# Patient Record
Sex: Male | Born: 1970 | Hispanic: No | Marital: Married | Smoking: Never smoker
Health system: Southern US, Community
[De-identification: ages and names within clinical notes are randomized; demographics above are authoritative.]

---

## 1998-03-20 ENCOUNTER — Other Ambulatory Visit: Admission: RE | Admit: 1998-03-20 | Discharge: 1998-03-20 | Payer: Self-pay | Admitting: Otolaryngology

## 2006-04-15 ENCOUNTER — Ambulatory Visit: Payer: Self-pay | Admitting: Family Medicine

## 2006-05-18 ENCOUNTER — Ambulatory Visit: Payer: Self-pay | Admitting: Family Medicine

## 2006-06-16 ENCOUNTER — Ambulatory Visit: Payer: Self-pay | Admitting: Family Medicine

## 2006-07-08 ENCOUNTER — Encounter: Admission: RE | Admit: 2006-07-08 | Discharge: 2006-07-08 | Payer: Self-pay | Admitting: Family Medicine

## 2007-08-05 IMAGING — CT CT PARANASAL SINUSES LIMITED
1 series · 16 of 28 positions shown, 20 images · non-contrast
Comparison: none

CLINICAL DATA: Sinusitis.  Congestion.  
 LIMITED CT OF PARANASAL SINUSES:
TECHNIQUE: Limited coronal CT images were obtained through the paranasal sinuses without intravenous contrast.

[Series 2: limited sinus · axial · 0.33mm/px · z∈[+6,+107]mm · 16 of 28 slices shown, 20 images]
[im 2/28  brain]
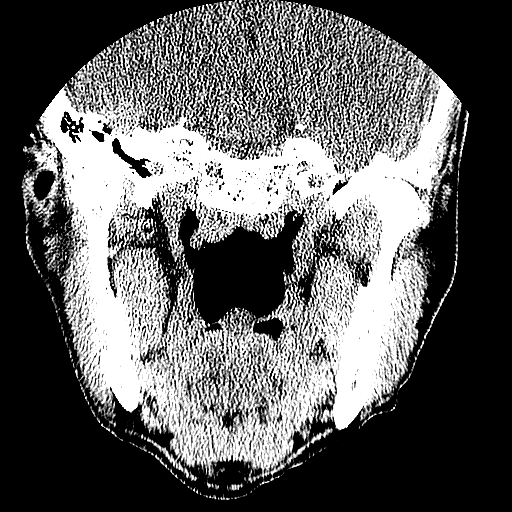
[im 2/28  bone]
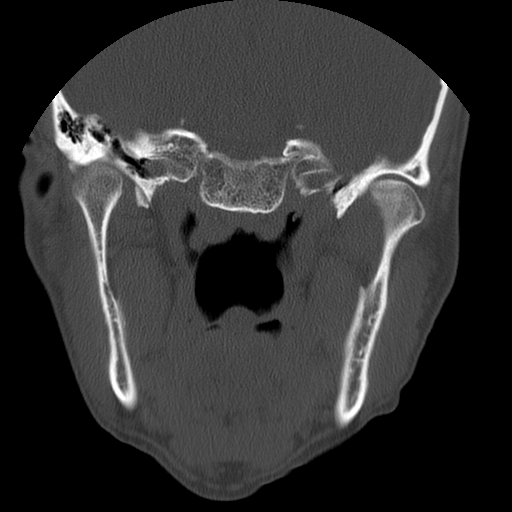
[im 4/28  bone]
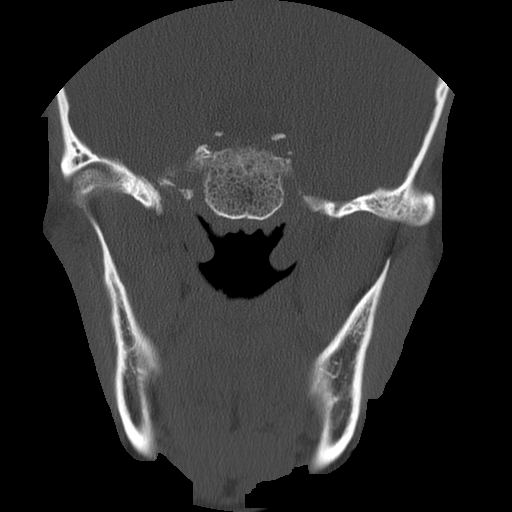
[im 6/28  bone]
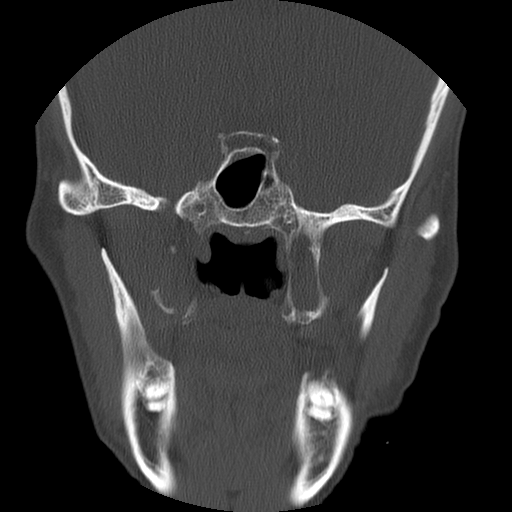
[im 7/28  bone]
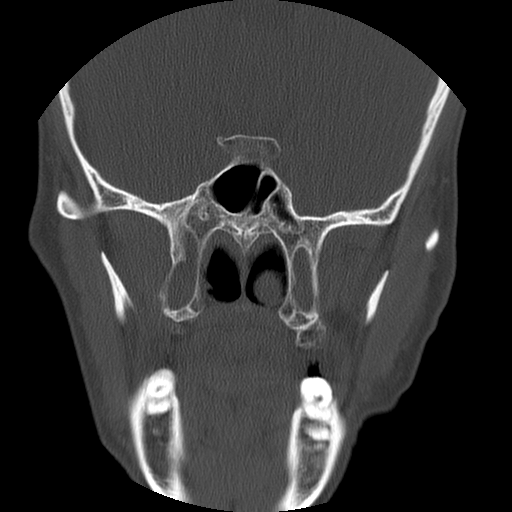
[im 9/28  brain]
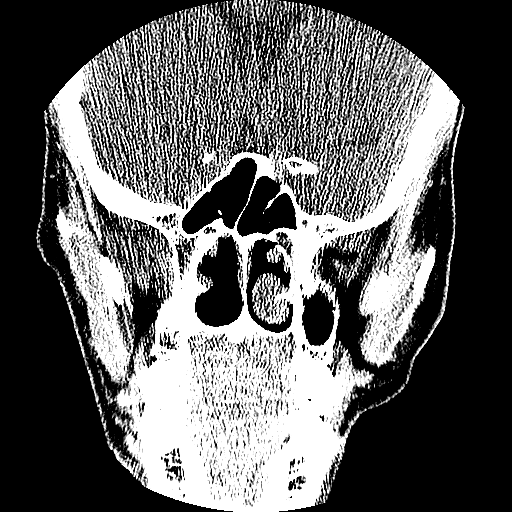
[im 9/28  bone]
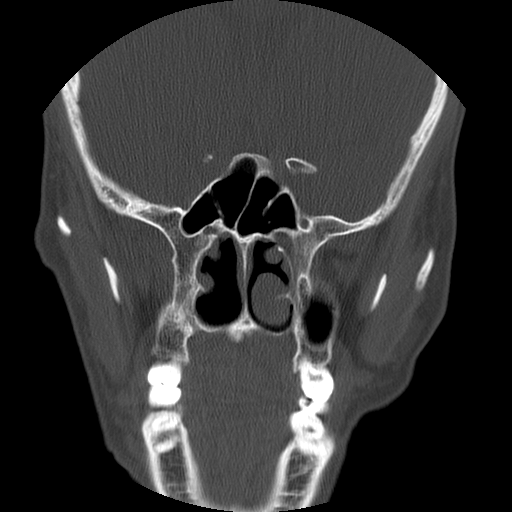
[im 10/28  bone]
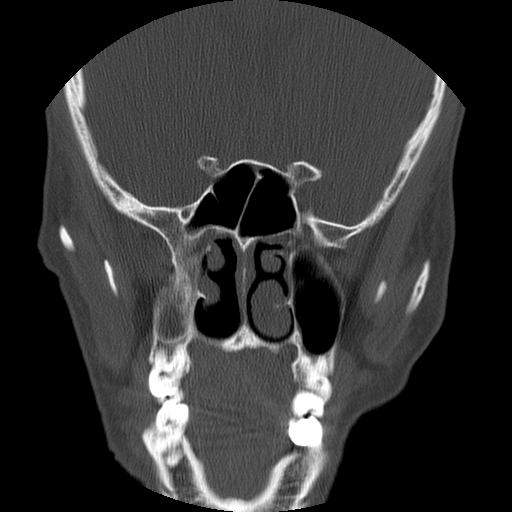
[im 12/28  bone]
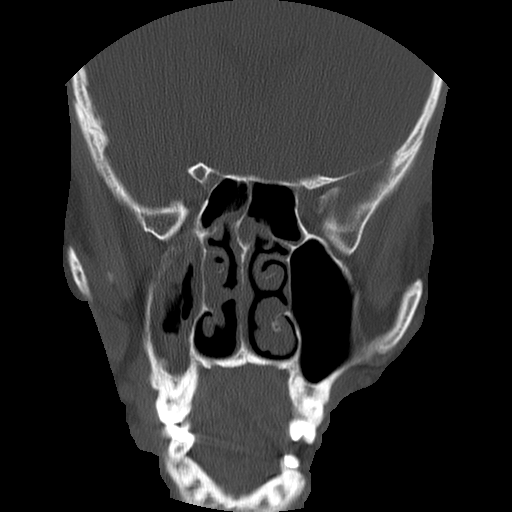
[im 14/28  bone]
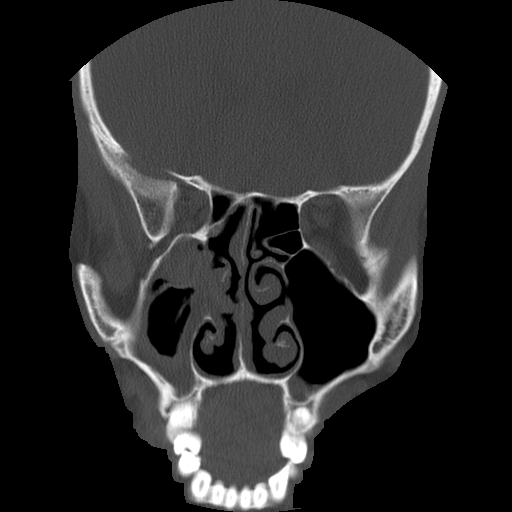
[im 15/28  brain]
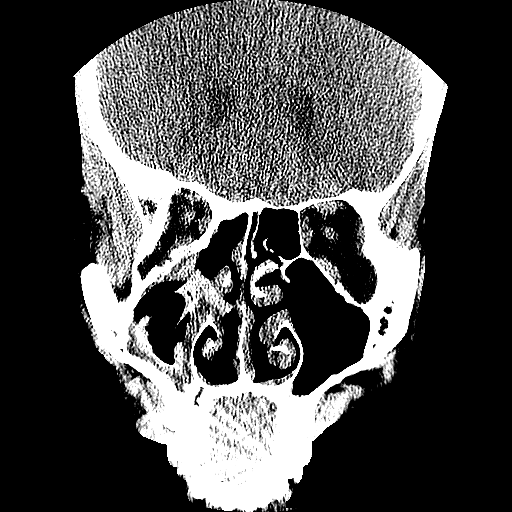
[im 15/28  bone]
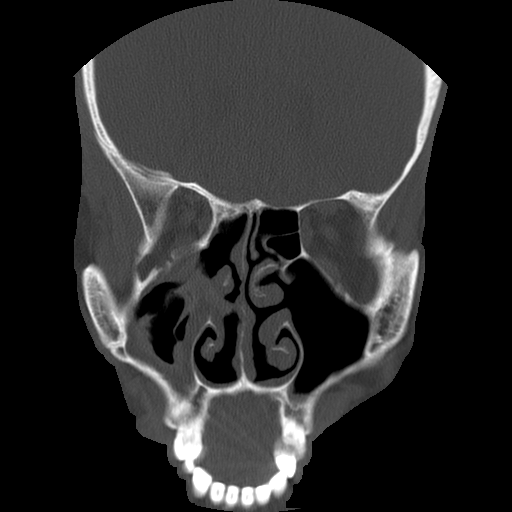
[im 17/28  bone]
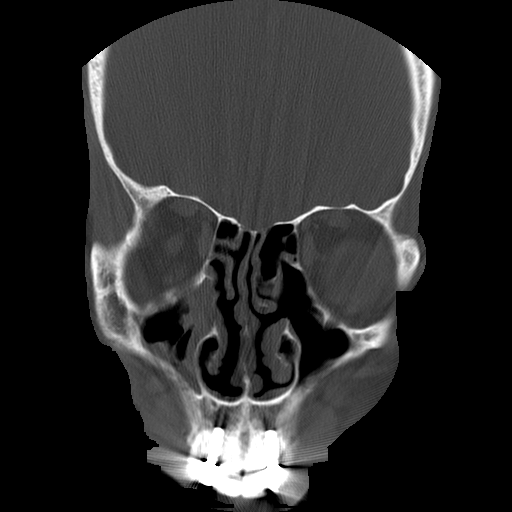
[im 19/28  bone]
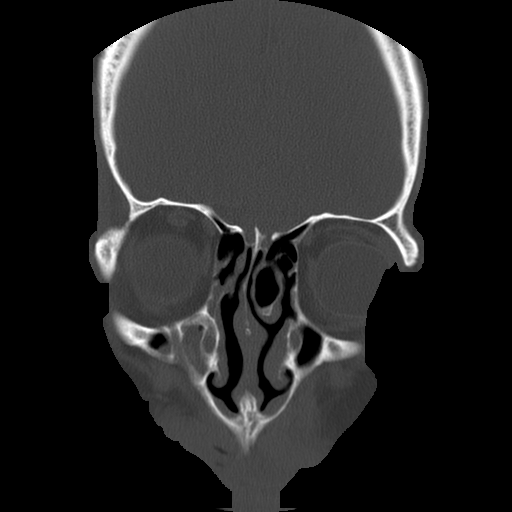
[im 20/28  bone]
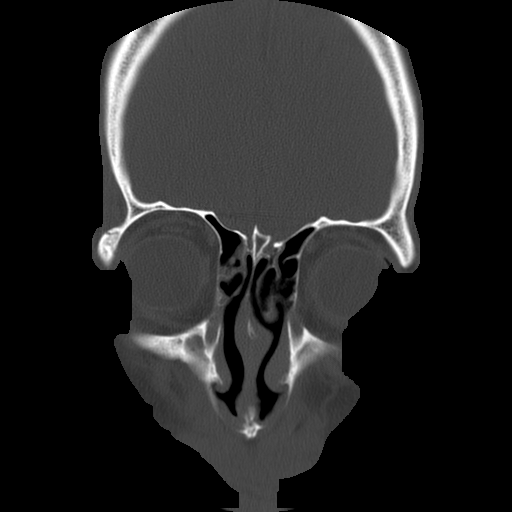
[im 22/28  brain]
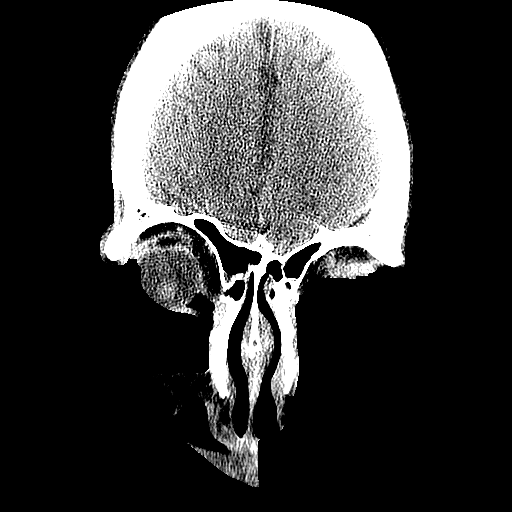
[im 22/28  bone]
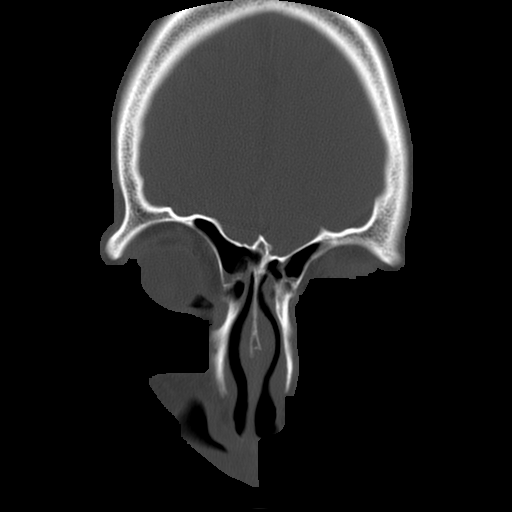
[im 23/28  bone]
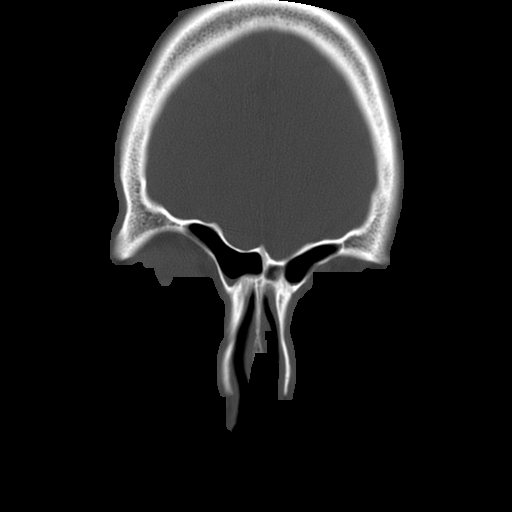
[im 25/28  bone]
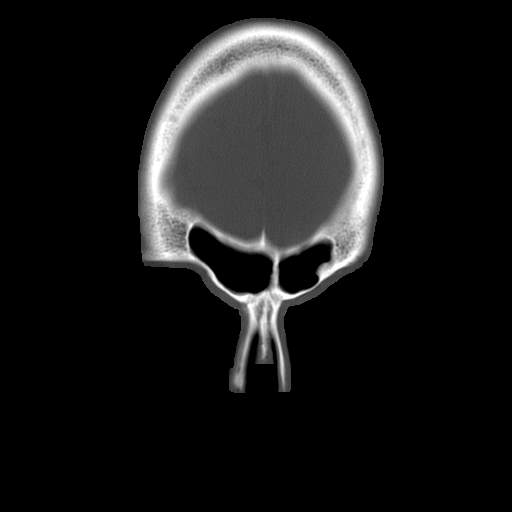
[im 27/28  bone]
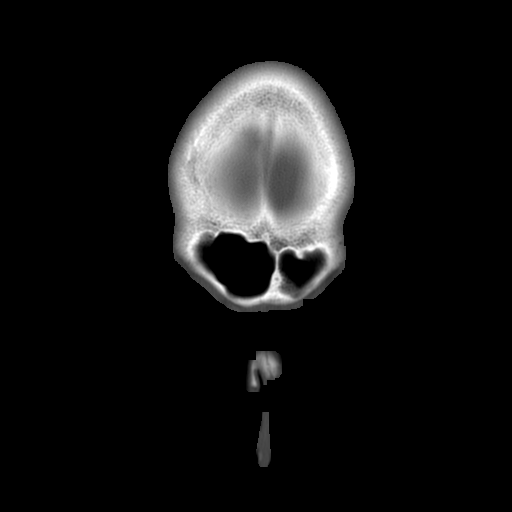

[16 of 28 positions shown; findings below may reference images not displayed]

FINDINGS: The patient has had nasoantral window formation bilaterally.  There is mucosal inflammatory disease of the left maxillary sinus.  I do not see any free fluid in the sinus.  The right maxillary sinus is clear except for a small retention cyst at the floor.  The frontal sinus is clear.  The sphenoid and ethmoid regions show some areas of mild mucosal thickening.  The nasal septum does not appear significantly deviated.
IMPRESSION: 1.  Previous nasoantral windows bilaterally.
 2.  Mucosal inflammatory disease of the left maxillary sinus, but no free fluid. 
 3.  Mucosal inflammatory changes of the anterior sphenoid and the ethmoid regions, fairly mild, but present.

## 2007-08-17 ENCOUNTER — Ambulatory Visit: Payer: Self-pay | Admitting: Family Medicine

## 2007-09-18 ENCOUNTER — Ambulatory Visit: Payer: Self-pay | Admitting: Family Medicine

## 2009-08-25 ENCOUNTER — Ambulatory Visit: Payer: Self-pay | Admitting: Family Medicine

## 2010-10-03 ENCOUNTER — Encounter: Payer: Self-pay | Admitting: Family Medicine

## 2016-08-23 DIAGNOSIS — F320 Major depressive disorder, single episode, mild: Secondary | ICD-10-CM | POA: Diagnosis not present

## 2016-10-04 DIAGNOSIS — F320 Major depressive disorder, single episode, mild: Secondary | ICD-10-CM | POA: Diagnosis not present

## 2016-10-11 DIAGNOSIS — F320 Major depressive disorder, single episode, mild: Secondary | ICD-10-CM | POA: Diagnosis not present

## 2016-10-18 DIAGNOSIS — F320 Major depressive disorder, single episode, mild: Secondary | ICD-10-CM | POA: Diagnosis not present

## 2016-10-25 DIAGNOSIS — F320 Major depressive disorder, single episode, mild: Secondary | ICD-10-CM | POA: Diagnosis not present

## 2016-11-01 DIAGNOSIS — F320 Major depressive disorder, single episode, mild: Secondary | ICD-10-CM | POA: Diagnosis not present

## 2016-11-08 DIAGNOSIS — F320 Major depressive disorder, single episode, mild: Secondary | ICD-10-CM | POA: Diagnosis not present

## 2016-11-15 DIAGNOSIS — F320 Major depressive disorder, single episode, mild: Secondary | ICD-10-CM | POA: Diagnosis not present

## 2016-11-16 DIAGNOSIS — F320 Major depressive disorder, single episode, mild: Secondary | ICD-10-CM | POA: Diagnosis not present

## 2016-11-16 DIAGNOSIS — R197 Diarrhea, unspecified: Secondary | ICD-10-CM | POA: Diagnosis not present

## 2016-11-16 DIAGNOSIS — F988 Other specified behavioral and emotional disorders with onset usually occurring in childhood and adolescence: Secondary | ICD-10-CM | POA: Diagnosis not present

## 2016-11-16 DIAGNOSIS — Z7689 Persons encountering health services in other specified circumstances: Secondary | ICD-10-CM | POA: Diagnosis not present

## 2016-11-22 DIAGNOSIS — F320 Major depressive disorder, single episode, mild: Secondary | ICD-10-CM | POA: Diagnosis not present

## 2016-12-09 DIAGNOSIS — F320 Major depressive disorder, single episode, mild: Secondary | ICD-10-CM | POA: Diagnosis not present

## 2016-12-15 DIAGNOSIS — R5383 Other fatigue: Secondary | ICD-10-CM | POA: Diagnosis not present

## 2016-12-15 DIAGNOSIS — R197 Diarrhea, unspecified: Secondary | ICD-10-CM | POA: Diagnosis not present

## 2016-12-15 DIAGNOSIS — Z136 Encounter for screening for cardiovascular disorders: Secondary | ICD-10-CM | POA: Diagnosis not present

## 2016-12-15 DIAGNOSIS — F322 Major depressive disorder, single episode, severe without psychotic features: Secondary | ICD-10-CM | POA: Diagnosis not present

## 2016-12-15 DIAGNOSIS — Z1389 Encounter for screening for other disorder: Secondary | ICD-10-CM | POA: Diagnosis not present

## 2016-12-15 DIAGNOSIS — Z131 Encounter for screening for diabetes mellitus: Secondary | ICD-10-CM | POA: Diagnosis not present

## 2016-12-15 DIAGNOSIS — F988 Other specified behavioral and emotional disorders with onset usually occurring in childhood and adolescence: Secondary | ICD-10-CM | POA: Diagnosis not present

## 2016-12-15 DIAGNOSIS — Z13220 Encounter for screening for lipoid disorders: Secondary | ICD-10-CM | POA: Diagnosis not present

## 2016-12-28 DIAGNOSIS — F320 Major depressive disorder, single episode, mild: Secondary | ICD-10-CM | POA: Diagnosis not present

## 2017-01-07 DIAGNOSIS — F320 Major depressive disorder, single episode, mild: Secondary | ICD-10-CM | POA: Diagnosis not present

## 2017-01-17 DIAGNOSIS — F320 Major depressive disorder, single episode, mild: Secondary | ICD-10-CM | POA: Diagnosis not present

## 2017-01-17 DIAGNOSIS — F988 Other specified behavioral and emotional disorders with onset usually occurring in childhood and adolescence: Secondary | ICD-10-CM | POA: Diagnosis not present

## 2017-01-31 DIAGNOSIS — D485 Neoplasm of uncertain behavior of skin: Secondary | ICD-10-CM | POA: Diagnosis not present

## 2017-01-31 DIAGNOSIS — L820 Inflamed seborrheic keratosis: Secondary | ICD-10-CM | POA: Diagnosis not present

## 2017-01-31 DIAGNOSIS — L821 Other seborrheic keratosis: Secondary | ICD-10-CM | POA: Diagnosis not present

## 2017-01-31 DIAGNOSIS — D225 Melanocytic nevi of trunk: Secondary | ICD-10-CM | POA: Diagnosis not present

## 2017-01-31 DIAGNOSIS — L578 Other skin changes due to chronic exposure to nonionizing radiation: Secondary | ICD-10-CM | POA: Diagnosis not present

## 2017-02-22 DIAGNOSIS — F320 Major depressive disorder, single episode, mild: Secondary | ICD-10-CM | POA: Diagnosis not present

## 2017-04-04 DIAGNOSIS — F320 Major depressive disorder, single episode, mild: Secondary | ICD-10-CM | POA: Diagnosis not present

## 2017-04-19 DIAGNOSIS — F320 Major depressive disorder, single episode, mild: Secondary | ICD-10-CM | POA: Diagnosis not present

## 2017-04-19 DIAGNOSIS — F988 Other specified behavioral and emotional disorders with onset usually occurring in childhood and adolescence: Secondary | ICD-10-CM | POA: Diagnosis not present

## 2017-04-27 DIAGNOSIS — F320 Major depressive disorder, single episode, mild: Secondary | ICD-10-CM | POA: Diagnosis not present

## 2017-05-11 DIAGNOSIS — F320 Major depressive disorder, single episode, mild: Secondary | ICD-10-CM | POA: Diagnosis not present

## 2017-05-23 DIAGNOSIS — F320 Major depressive disorder, single episode, mild: Secondary | ICD-10-CM | POA: Diagnosis not present

## 2017-06-13 DIAGNOSIS — F320 Major depressive disorder, single episode, mild: Secondary | ICD-10-CM | POA: Diagnosis not present

## 2017-07-20 DIAGNOSIS — F320 Major depressive disorder, single episode, mild: Secondary | ICD-10-CM | POA: Diagnosis not present

## 2017-08-17 DIAGNOSIS — F320 Major depressive disorder, single episode, mild: Secondary | ICD-10-CM | POA: Diagnosis not present

## 2017-10-05 DIAGNOSIS — F320 Major depressive disorder, single episode, mild: Secondary | ICD-10-CM | POA: Diagnosis not present

## 2017-11-04 DIAGNOSIS — J0140 Acute pansinusitis, unspecified: Secondary | ICD-10-CM | POA: Diagnosis not present

## 2017-11-04 DIAGNOSIS — R5381 Other malaise: Secondary | ICD-10-CM | POA: Diagnosis not present

## 2017-11-04 DIAGNOSIS — J101 Influenza due to other identified influenza virus with other respiratory manifestations: Secondary | ICD-10-CM | POA: Diagnosis not present

## 2017-11-16 DIAGNOSIS — F320 Major depressive disorder, single episode, mild: Secondary | ICD-10-CM | POA: Diagnosis not present

## 2018-01-23 DIAGNOSIS — F320 Major depressive disorder, single episode, mild: Secondary | ICD-10-CM | POA: Diagnosis not present

## 2018-04-17 DIAGNOSIS — F320 Major depressive disorder, single episode, mild: Secondary | ICD-10-CM | POA: Diagnosis not present

## 2018-06-19 ENCOUNTER — Ambulatory Visit (INDEPENDENT_AMBULATORY_CARE_PROVIDER_SITE_OTHER): Payer: BLUE CROSS/BLUE SHIELD | Admitting: Psychiatry

## 2018-06-19 DIAGNOSIS — F320 Major depressive disorder, single episode, mild: Secondary | ICD-10-CM | POA: Diagnosis not present

## 2018-06-19 NOTE — Progress Notes (Signed)
      [facility] Counselor/Therapist Progress Note   Patient ID: Martin Perez, MRN: [id number]  Date: 06/19/2018  Timespent: 45 minutes  Treatment Type: Individual  Subjective: Client reports that he did not get the job at the [employer].  He is disappointed and is unsure what he and his wife will do financially going forward.  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  They have recently moved into their rental house here in [place].  His wife is homeschooling their children.  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  The client feels like he is doing well overall but he states he still has self image issues and insecurity.  Interventions:Solution Focused and Supportive  Mental Status Exam:   Appearance:   Well Groomed     Behavior:  Appropriate  Motor:  Normal  Speech/Language:   Clear and Coherent  Affect:  Appropriate  Mood:  anxious  Thought process:  Coherent  Thought content:    Logical  Perceptual disturbances:    Normal  Orientation:  Full (Time, Place, and Person)  Attention:  Good  Concentration:  good  Memory:  Immediate  Fund of knowledge:   Good  Insight:    Good  Judgment:   Good  Impulse Control:  good    Reported Symptoms: Insecurity, self-image issues  Risk Assessment: Danger to Self:  No Self-injurious Behavior: No Danger to Others: No Duty to Warn:no Physical Aggression / Violence:No  Access to Firearms a concern: No  Gang Involvement:No   Diagnosis:   ICD-10-CM   1. Major depressive disorder, single episode, mild (HCC) F32.0      Plan: Apply for  jobs, meet with local pastors for referrals. Exercise, selfcare  Gelene Mink Emilie Carp, [facility]

## 2018-08-14 ENCOUNTER — Ambulatory Visit (INDEPENDENT_AMBULATORY_CARE_PROVIDER_SITE_OTHER): Payer: BLUE CROSS/BLUE SHIELD | Admitting: Psychiatry

## 2018-08-14 ENCOUNTER — Encounter (INDEPENDENT_AMBULATORY_CARE_PROVIDER_SITE_OTHER): Payer: Self-pay

## 2018-08-14 ENCOUNTER — Encounter: Payer: Self-pay | Admitting: Psychiatry

## 2018-08-14 DIAGNOSIS — F4321 Adjustment disorder with depressed mood: Secondary | ICD-10-CM | POA: Diagnosis not present

## 2018-08-14 NOTE — Progress Notes (Signed)
      [facility] Counselor/Therapist Progress Note  Patient ID: Martin Perez, MRN: [id number],    Date: 08/14/2018  Time Spent: 46 minutes   Treatment Type: Individual Therapy  Reported Symptoms: Irritability  Mental Status Exam:  Appearance:   Well Groomed     Behavior:  Appropriate  Motor:  Normal  Speech/Language:   Clear and Coherent  Affect:  Appropriate  Mood:  irritable  Thought process:  normal  Thought content:    WNL  Sensory/Perceptual disturbances:    WNL  Orientation:  oriented to person, place, time/date and situation  Attention:  Good  Concentration:  Good  Memory:  WNL  Fund of knowledge:   Good  Insight:    Good  Judgment:   Good  Impulse Control:  Good   Risk Assessment: Danger to Self:  No Self-injurious Behavior: No Danger to Others: No Duty to Warn:no Physical Aggression / Violence:No  Access to Firearms a concern: No  Gang Involvement:No   Subjective: The client reports that he and his family were unable to move into the rental house that they owned.  They had wanted to get a construction alone to do some remodeling but the underwriter felt like their previous income was not a good match.  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  He states, "it is okay for the moment."  Client states that he and his wife and family still live with his mother in [place].  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  Overall he feels that he and his wife are doing okay but he knows that he needs to do some marriage counseling with her.  In the big picture he feels like he is doing okay.  There is still some irritability here and there.  He agrees to follow-up in February or March.  Interventions: Solution-Oriented/Positive Psychology  and Insight-Oriented  Diagnosis:   ICD-10-CM   1. Adjustment disorder with depressed mood F43.21     Plan: Marriage counseling, vocational search.  Gelene MinkFrederick Kamal Jurgens, [facility]

## 2018-09-25 ENCOUNTER — Ambulatory Visit: Payer: BLUE CROSS/BLUE SHIELD | Admitting: Psychiatry

## 2018-09-25 ENCOUNTER — Ambulatory Visit: Payer: Self-pay | Admitting: Psychiatry

## 2018-11-13 DIAGNOSIS — J4520 Mild intermittent asthma, uncomplicated: Secondary | ICD-10-CM | POA: Diagnosis not present

## 2018-11-13 DIAGNOSIS — Z79899 Other long term (current) drug therapy: Secondary | ICD-10-CM | POA: Diagnosis not present

## 2018-11-13 DIAGNOSIS — M25542 Pain in joints of left hand: Secondary | ICD-10-CM | POA: Diagnosis not present

## 2018-11-14 ENCOUNTER — Ambulatory Visit (INDEPENDENT_AMBULATORY_CARE_PROVIDER_SITE_OTHER): Payer: 59 | Admitting: Psychiatry

## 2018-11-14 ENCOUNTER — Encounter: Payer: Self-pay | Admitting: Psychiatry

## 2018-11-14 DIAGNOSIS — F4321 Adjustment disorder with depressed mood: Secondary | ICD-10-CM

## 2018-11-14 NOTE — Progress Notes (Signed)
      [facility] Counselor/Therapist Progress Note  Patient ID: Martin Perez, MRN: [id number],    Date: 11/14/2018  Time Spent: 51 minutes   Treatment Type: Individual Therapy  Reported Symptoms: anxiety, stress  Mental Status Exam:  Appearance:   Casual and Well Groomed     Behavior:  Appropriate  Motor:  Normal  Speech/Language:   Clear and Coherent  Affect:  Appropriate  Mood:  anxious and irritable  Thought process:  normal  Thought content:    WNL  Sensory/Perceptual disturbances:    WNL  Orientation:  oriented to person, place, time/date and situation  Attention:  Good  Concentration:  Good  Memory:  WNL  Fund of knowledge:   Good  Insight:    Good  Judgment:   Good  Impulse Control:  Good   Risk Assessment: Danger to Self:  No Self-injurious Behavior: No Danger to Others: No Duty to Warn:no Access to Firearms a concern: No  Gang Involvement:No   Subjective: 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  Unfortunately he is not getting paid enough.  Client's concerns today was; 1) he has a bad habit of staying late at the office. 2) should his family moved to [place] versus continuing to live in [place]. Today we used EMDR around the client staying late at his office.  He realized that he did not want to go home.  He wanted to avoid the chaos with his 3 boys at home.  We discussed the fact that at the end of the day his Vyvanse is wearing off and he has less focusing ability and more irritability.  The client has used the amino acid theanine in the past which has worked well for him in reducing his physical and mental stress.  The client agrees to take some before he comes home so that he will have more margin.  This way he can give his wife a break as well.  The client experienced a lot of relief coming to this conclusion.  His subjective units of distress went from a 7  to less than 2.   Interventions: Solution-Oriented/Positive Psychology, Eye Movement Desensitization and Reprocessing (EMDR) and Insight-Oriented  Diagnosis:   ICD-10-CM   1. Adjustment disorder with depressed mood F43.21     Plan: l-theanine, clock out earlier at work, exercise, family engagement.  This record has been created using [employer].  Chart creation errors have been sought, but Karrie Fluellen not always have been located and corrected. Such creation errors do not reflect on the standard of medical care.   Javionna Leder, [place]

## 2019-02-08 ENCOUNTER — Ambulatory Visit (INDEPENDENT_AMBULATORY_CARE_PROVIDER_SITE_OTHER): Payer: 59 | Admitting: Psychiatry

## 2019-02-08 ENCOUNTER — Other Ambulatory Visit: Payer: Self-pay

## 2019-02-08 ENCOUNTER — Encounter: Payer: Self-pay | Admitting: Psychiatry

## 2019-02-08 DIAGNOSIS — F4323 Adjustment disorder with mixed anxiety and depressed mood: Secondary | ICD-10-CM

## 2019-02-08 NOTE — Progress Notes (Signed)
      [facility] Counselor/Therapist Progress Note  Patient ID: Martin Perez, MRN: [id number],    Date: 02/08/2019  Time Spent: 50 minutes   Treatment Type: Individual Therapy  Reported Symptoms: anxiety, frustration  Mental Status Exam:  Appearance:   Casual and Well Groomed     Behavior:  Appropriate  Motor:  Normal  Speech/Language:   Clear and Coherent  Affect:  Appropriate  Mood:  anxious and irritable  Thought process:  normal  Thought content:    WNL  Sensory/Perceptual disturbances:    WNL  Orientation:  oriented to person, place, time/date and situation  Attention:  Good  Concentration:  Good  Memory:  WNL  Fund of knowledge:   Good  Insight:    Good  Judgment:   Good  Impulse Control:  Good   Risk Assessment: Danger to Self:  No Self-injurious Behavior: No Danger to Others: No Duty to Warn:no Physical Aggression / Violence:No  Access to Firearms a concern: No  Gang Involvement:No   Subjective: I met with the client face-to-face today.  We both had facemasks.  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 Today the client wanted to work on assertiveness.  "I tend to be passive.  I lack self-confidence." The client has an issue with one of his coworkers.  We focused on this target using eye-movement.  His negative thought was, "what right do I have?"  He felt fear and anger in his chest.  His subjective units of distress started at a 5+ and ended less than 2. As the client processed the fear and anger that he felt using eye-movement he realized that he was afraid of retaliation.  He also felt over responsible for the other person's feelings.  As we discussed this the client understood that that behavior to be codependent.  I gave the client a handout on  assertive rights and irrational versus rational beliefs.  The client went throusgh the assertive rights and saw that he did have the right to speak his mind and asked for what he needed.  As he continued to process his anxiety and anger dropped to less than 2.  "I do have the right for what I want."  Interventions: Assertiveness/Communication, Solution-Oriented/Positive Psychology, Psycho-education/Bibliotherapy, Eye Movement Desensitization and Reprocessing (EMDR) and Insight-Oriented  Diagnosis:   ICD-10-CM   1. Adjustment disorder with mixed anxiety and depressed mood F43.23     Plan: Review assertive handout, boundaries, assertiveness, self-care, exercise, positive self talk.  This record has been created using [employer].  Chart creation errors have been sought, but Jaelan Rasheed not always have been located and corrected. Such creation errors do not reflect on the standard of medical care.  Ludmila Ebarb, [facility]

## 2019-05-10 ENCOUNTER — Ambulatory Visit: Payer: 59 | Admitting: Psychiatry

## 2019-06-07 ENCOUNTER — Other Ambulatory Visit: Payer: Self-pay

## 2019-06-07 ENCOUNTER — Ambulatory Visit (INDEPENDENT_AMBULATORY_CARE_PROVIDER_SITE_OTHER): Payer: 59 | Admitting: Psychiatry

## 2019-06-07 ENCOUNTER — Encounter: Payer: Self-pay | Admitting: Psychiatry

## 2019-06-07 DIAGNOSIS — F4323 Adjustment disorder with mixed anxiety and depressed mood: Secondary | ICD-10-CM | POA: Diagnosis not present

## 2019-06-07 NOTE — Progress Notes (Signed)
[facility] Counselor/Therapist Progress Note  Patient ID: Martin Perez, MRN: [id number],    Date: 06/07/2019  Time Spent: 50 minutes   Treatment Type: Individual Therapy  Reported Symptoms: anxious, irritated, defensive.  Mental Status Exam:  Appearance:   Well Groomed     Behavior:  Appropriate  Motor:  Normal  Speech/Language:   Clear and Coherent  Affect:  Appropriate  Mood:  anxious and irritable  Thought process:  normal  Thought content:    WNL  Sensory/Perceptual disturbances:    WNL  Orientation:  oriented to person, place, time/date and situation  Attention:  Good  Concentration:  Good  Memory:  WNL  Fund of knowledge:   Good  Insight:    Good  Judgment:   Good  Impulse Control:  Good   Risk Assessment: Danger to Self:  No Self-injurious Behavior: No Danger to Others: No Duty to Warn:no Physical Aggression / Violence:No  Access to Firearms a concern: No  Gang Involvement:No   Subjective: The client states that he is still employed for which he is thankful.  The client states today that he wants to work on his defensiveness with his wife.  He notes that if she asks him questions they can come across as critical to him.  The messages he interprets are, "I am not good enough.  I am a screw up.  I am not living up to my potential." I used eye-movement with the client around this defensiveness he feels with his wife.  His negative cognition is, "I must be perfect."  He feels it in his chest with anxiety and tension.  His subjective units of distress is a 6.  As the client processed he realizes that he has been overtaxed.  He is remodeling their house here in [place] that they hope to move into in October.  He still commutes back and forth from [place] to [place] for work.  He has 3 boys at home that have some level of attention deficit all under the age of 63.  His wife homeschools them full-time.  His job can require up to 12 hours a day plus the  remodeling at their house.  Money is tight which also increases his stress.  The client does get up early to exercise but really has little time to spend with his wife.  As the client continued to process he realized he needed more radical acceptance that his life is the way it is in this season.  I used the bilateral stimulation hand paddles with the client.  He focused on the image of being at the beach.  He was able to quiet himself and reduce his subjective units of distress to less than 1.  I gave the client a handout on mindfulness practices.  I asked him to pick 1 and practice it for 5 minutes each day.  The client agreed.  The goal is to gain control of his mind so that it is not racing all over the place.  Also to be able to use the practice reflexively during the day if he needs to.  I also asked him to include stretching and his exercise regimen to help with his overall tension.  He agreed.  When the client thought about interacting with his wife he felt much calmer.  Interventions: Assertiveness/Communication, Mindfulness Meditation, Motivational Interviewing, Solution-Oriented/Positive Psychology, Psycho-education/Bibliotherapy, Eye Movement Desensitization and Reprocessing (EMDR) and Insight-Oriented  Diagnosis:   ICD-10-CM   1. Adjustment disorder with  mixed anxiety and depressed mood  F43.23     Plan: Mindfulness, stretching, exercise, self-care, boundaries, assertiveness, positive self talk, radical acceptance.  Raissa Dam, [facility]

## 2019-07-24 ENCOUNTER — Encounter: Payer: Self-pay | Admitting: Psychiatry

## 2019-07-24 ENCOUNTER — Other Ambulatory Visit: Payer: Self-pay

## 2019-07-24 ENCOUNTER — Ambulatory Visit (INDEPENDENT_AMBULATORY_CARE_PROVIDER_SITE_OTHER): Payer: 59 | Admitting: Psychiatry

## 2019-07-24 DIAGNOSIS — F4323 Adjustment disorder with mixed anxiety and depressed mood: Secondary | ICD-10-CM

## 2019-07-24 NOTE — Progress Notes (Signed)
[facility] Counselor/Therapist Progress Note  Patient ID: Martin Perez, MRN: [id number],    Date: 07/24/2019  Time Spent: 50 minutes   Treatment Type: Individual Therapy  Reported Symptoms: anxious, sad, stressed  Mental Status Exam:  Appearance:   Well Groomed     Behavior:  Appropriate  Motor:  Normal  Speech/Language:   Clear and Coherent  Affect:  Appropriate  Mood:  anxious and sad  Thought process:  normal  Thought content:    WNL  Sensory/Perceptual disturbances:    WNL  Orientation:  oriented to person, place, time/date and situation  Attention:  Good  Concentration:  Good  Memory:  WNL  Fund of knowledge:   Good  Insight:    Good  Judgment:   Good  Impulse Control:  Good   Risk Assessment: Danger to Self:  No Self-injurious Behavior: No Danger to Others: No Duty to Warn:no Physical Aggression / Violence:No  Access to Firearms a concern: No  Gang Involvement:No   Subjective: The client states that he had to drop the prescription Vyvanse due to the cost.  The client has a high deductible plan through his company and he had to move to Adderall XR.  "It makes me spacey and I lose motivation in the afternoon."  In addition to working 10-hour day the client goes to the house that he and his wife are renovating and works on that until late in the evening.  He states he is rarely home and probably gets 4 to 6 hours of sleep at night. We discussed that this is not a healthy schedule for him.  No doubt it does not help his attention deficit disorder.  We discussed talking to his physician to see if he could take an early afternoon dose to take him through the evening.  The client agreed to do so.  The client is taking a high dose of fish oil along with his ADD meds. The client has a stress level on a subjective units of distress of 8.  We used eye-movement around the issues with his house, work, the long hours, his children and his wife.  His negative  cognition is, "I have abandoned them."  He feels stress and anxiety and sadness in the center of his chest.  As the client processed he knows that he needs to cut the long hours out.  They are trying to get into the house the weekend after Thanksgiving.  He feels like he just needs to push through until then.  I advised the client to have a longer conversation with his wife discussing whether or not he needs to dial back his time so he can spend more time at home.  The client will consider this as well. The client identifies his middle child as having what seems like an intermittent explosive disorder.  It makes things very difficult for his other 2 children and also difficult for his wife during the course of the day.  As he continued to process he noted that he really does try to engage with his family when he is there.  As he discussed this his subjective units of distress dropped to less than 2.  He knows if he begins to implement the things we discussed, "it will be okay."  Which was his positive cognition at the end of the session.  Interventions: Assertiveness/Communication, Motivational Interviewing, Solution-Oriented/Positive Psychology, [employer] Desensitization and Reprocessing (EMDR) and Insight-Oriented  Diagnosis:   ICD-10-CM  1. Adjustment disorder with mixed anxiety and depressed mood  F43.23     Plan: Boundaries, assertiveness, engagement with his wife and children, mood independent behavior, positive self talk.  Shaolin Armas, [facility]

## 2019-08-29 ENCOUNTER — Other Ambulatory Visit: Payer: Self-pay

## 2019-08-29 ENCOUNTER — Encounter: Payer: Self-pay | Admitting: Psychiatry

## 2019-08-29 ENCOUNTER — Ambulatory Visit (INDEPENDENT_AMBULATORY_CARE_PROVIDER_SITE_OTHER): Payer: 59 | Admitting: Psychiatry

## 2019-08-29 DIAGNOSIS — F4323 Adjustment disorder with mixed anxiety and depressed mood: Secondary | ICD-10-CM | POA: Diagnosis not present

## 2019-08-29 NOTE — Progress Notes (Signed)
      [facility] Counselor/Therapist Progress Note  Patient ID: Martin Perez, MRN: [id number],    Date: 08/29/2019  Time Spent: 20 Minutes   Treatment Type: Individual Therapy  Reported Symptoms: sad, anxious, lack of motivation  Mental Status Exam:  Appearance:   Casual     Behavior:  Appropriate  Motor:  Normal  Speech/Language:   Clear and Coherent  Affect:  Appropriate  Mood:  anxious and sad  Thought process:  normal  Thought content:    WNL  Sensory/Perceptual disturbances:    WNL  Orientation:  oriented to person, place, time/date and situation  Attention:  Good  Concentration:  Good  Memory:  WNL  Fund of knowledge:   Good  Insight:    Good  Judgment:   Good  Impulse Control:  Good   Risk Assessment: Danger to Self:  No Self-injurious Behavior: No Danger to Others: No Duty to Warn:no Physical Aggression / Violence:No  Access to Firearms a concern: No  Gang Involvement:No   Subjective: "I am struggling with procrastination."  The client states that he has been changed from Vyvanse to Adderall ER.  He does not find it as effective.  He notes a little depression is kicking in.  He is sleeping anywhere from 5 to 7 hours a night.  7 hours is the best but usually it is 5-6.  He and his wife are also having difficulties with their middle son who is 27 years old with anger issues. We started today focusing on the transition that is taking place in the client's life moving from [place] to [place].  His negative cognition is, "I am not doing anything significant."  He feels sadness and anxiety in his chest is subjective units of distress is a 6+.  As the client processed he stated, "sometimes I am just addicted to the chaos."  He feels that emptiness especially with his work.  He is glad to be employed but he is not making enough money to support his family.  This makes him feel less than as a provider.  I will pointed out to the client that he has been faithful  in working and remodeling the house they are moving into.  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  Continuing to process I reframed for the client that this is a time a refining and humility for him.  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  His subjective units of distress dropped to less than 2.  We discussed the use of mindful prayer as a way to refocus himself.  Also the need for radical acceptance with where he is currently in his life as he waits for his next steps.  I also encouraged him to use more mood independent behavior to get the things done that he has difficulty with.  His positive cognition at the end of the session was, "God has me."  Interventions: Assertiveness/Communication, Mindfulness Meditation, Motivational Interviewing, Solution-Oriented/Positive Psychology, [employer] Desensitization and Reprocessing (EMDR) and Insight-Oriented  Diagnosis:   ICD-10-CM   1. Adjustment disorder with mixed anxiety and depressed mood  F43.23     Plan: Mindful prayer, assertiveness, self-care, sleep hygiene, positive self talk, mood independent behavior, radical acceptance, boundaries, exercise.  Martin Perez, [facility]

## 2019-10-11 ENCOUNTER — Encounter: Payer: Self-pay | Admitting: Psychiatry

## 2019-10-11 ENCOUNTER — Ambulatory Visit (INDEPENDENT_AMBULATORY_CARE_PROVIDER_SITE_OTHER): Payer: 59 | Admitting: Psychiatry

## 2019-10-11 ENCOUNTER — Other Ambulatory Visit: Payer: Self-pay

## 2019-10-11 DIAGNOSIS — F4323 Adjustment disorder with mixed anxiety and depressed mood: Secondary | ICD-10-CM

## 2019-10-11 NOTE — Progress Notes (Signed)
      [facility] Counselor/Therapist Progress Note  Patient ID: Vong Garringer, MRN: [id number],    Date: 10/11/2019  Time Spent: 50 minutes   Treatment Type: Individual Therapy  Reported Symptoms: anxious, sad  Mental Status Exam:  Appearance:   Well Groomed     Behavior:  Appropriate  Motor:  Normal  Speech/Language:   Clear and Coherent  Affect:  Appropriate  Mood:  anxious and sad  Thought process:  normal  Thought content:    WNL  Sensory/Perceptual disturbances:    WNL  Orientation:  oriented to person, place, time/date and situation  Attention:  Good  Concentration:  Good  Memory:  WNL  Fund of knowledge:   Good  Insight:    Good  Judgment:   Good  Impulse Control:  Good   Risk Assessment: Danger to Self:  No Self-injurious Behavior: No Danger to Others: No Duty to Warn:no Physical Aggression / Violence:No  Access to Firearms a concern: No  Gang Involvement:No   Subjective: The client states that they were able to move into their house the week before Christmas.  He feels that Christmas went very well.  Now that everything is completed he states, "I have this post house let down."  He also has had trouble procuring his attention deficit medication.  He was taking Vyvanse which became prohibitively expensive.  He has switched to Adderall which is more affordable but does not work as well.  He is still at his same job.  He enjoys the work but the income is still less than he needs to provide for his family.  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 Today the client wanted to focus on why he blames others for what he has done.  Today I used eye-movement with the client focusing on this issue.  As the client processed he stated that it Teddy Pena come from being a people pleaser.  "I expect myself to be perfect and when I am not it stresses me out."  We discussed this issue of imperfection which leads to  thoughts such as, "I am screwed up."  The client knows he has no grace for himself.  Instead he becomes very shameful which leads to his anger.  As we continue to process, I discussed with the client focusing on forgiveness versus perfection.  He realizes when he tries to be perfect it can lead to narcissism.  I explained that imperfection is the antidote to narcissism.  This made sense to the client.  He will work towards trying to embrace more grace and acceptance.  Interventions: Assertiveness/Communication, Mindfulness Meditation, Motivational Interviewing, Solution-Oriented/Positive Psychology, [employer] Desensitization and Reprocessing (EMDR) and Insight-Oriented  Diagnosis:   ICD-10-CM   1. Adjustment disorder with mixed anxiety and depressed mood  F43.23     Plan: Mindful prayer, acceptance, positive self talk, Delorise Shiner, assertiveness, boundaries, self-care, exercise, activities that replenish and refresh the client.  Gelene Mink Jaylenn Baiza, [facility]

## 2019-11-19 ENCOUNTER — Other Ambulatory Visit: Payer: Self-pay

## 2019-11-19 ENCOUNTER — Encounter: Payer: Self-pay | Admitting: Psychiatry

## 2019-11-19 ENCOUNTER — Ambulatory Visit (INDEPENDENT_AMBULATORY_CARE_PROVIDER_SITE_OTHER): Payer: 59 | Admitting: Psychiatry

## 2019-11-19 DIAGNOSIS — F4322 Adjustment disorder with anxiety: Secondary | ICD-10-CM | POA: Diagnosis not present

## 2019-11-19 NOTE — Progress Notes (Signed)
      [facility] Counselor/Therapist Progress Note  Patient ID: Martin Perez, MRN: [id number],    Date: 11/19/2019  Time Spent: 50 minutes   Treatment Type: Individual Therapy  Reported Symptoms: anxiety  Mental Status Exam:  Appearance:   Well Groomed     Behavior:  Appropriate  Motor:  Normal  Speech/Language:   Clear and Coherent  Affect:  Appropriate  Mood:  anxious  Thought process:  normal  Thought content:    WNL  Sensory/Perceptual disturbances:    WNL  Orientation:  oriented to person, place, time/date and situation  Attention:  Good  Concentration:  Good  Memory:  WNL  Fund of knowledge:   Good  Insight:    Good  Judgment:   Good  Impulse Control:  Good   Risk Assessment: Danger to Self:  No Self-injurious Behavior: No Danger to Others: No Duty to Warn:no Physical Aggression / Violence:No  Access to Firearms a concern: No  Gang Involvement:No   Subjective: The client states, "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  He is so busy with so many details.  This has caused a high level of stress for him.  He realizes that he is working too many hours and needs to be able to cut back.  He was able to get away this weekend with his wife which was very positive. Today we focused on the client's stress at work using eye-movement.  His negative cognition is, "I am overwhelmed."  He feels stress in his chest and his subjective units of distress is an 8.  As the client processed he discussed how he needs to have some other systems in place to keep track of everything that is going on.  I suggested he look at spreadsheets or other types of notetaking apps on his phone.  He agreed this would be helpful.  He also realized that part of his stress came in his intimate relationship with his wife.  He disclosed that he has some issues with premature ejaculation.  As we continued to process the  client stress around this reduced.  I explained to the client some basic things he could do to slow down his whole arousal process to gain more control.  The client agreed to try these and I gave him a handout on the types of Kegel exercises he could do that would be helpful.  The client stated he would try this.  His subjective units of distress at the end of the session was less than 3.  His positive cognition was, "I can do this."  Interventions: Assertiveness/Communication, Mindfulness Meditation, Motivational Interviewing, Solution-Oriented/Positive Psychology, Psycho-education/Bibliotherapy, Eye Movement Desensitization and Reprocessing (EMDR) and Insight-Oriented  Diagnosis:   ICD-10-CM   1. Adjustment disorder with anxiety  F43.22     Plan: Review handout, Kegel exercises, self-care, exercise, lists and spreadsheets, assertiveness, boundaries, positive self talk.  Gelene Mink Daishawn Lauf, [facility]

## 2020-01-01 ENCOUNTER — Ambulatory Visit (INDEPENDENT_AMBULATORY_CARE_PROVIDER_SITE_OTHER): Payer: 59 | Admitting: Psychiatry

## 2020-01-01 ENCOUNTER — Encounter: Payer: Self-pay | Admitting: Psychiatry

## 2020-01-01 ENCOUNTER — Other Ambulatory Visit: Payer: Self-pay

## 2020-01-01 DIAGNOSIS — F4322 Adjustment disorder with anxiety: Secondary | ICD-10-CM
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
## 2020-02-26 ENCOUNTER — Other Ambulatory Visit: Payer: Self-pay

## 2020-02-26 ENCOUNTER — Encounter: Payer: Self-pay | Admitting: Psychiatry

## 2020-02-26 ENCOUNTER — Ambulatory Visit (INDEPENDENT_AMBULATORY_CARE_PROVIDER_SITE_OTHER): Payer: 59 | Admitting: Psychiatry

## 2020-02-26 DIAGNOSIS — F4322 Adjustment disorder with anxiety: Secondary | ICD-10-CM

## 2020-02-26 NOTE — Progress Notes (Signed)
°      [facility] Counselor/Therapist Progress Note  Patient ID: Yaniel Limbaugh, MRN: [id number],    Date: 02/26/2020  Time Spent: 50 minutes   Treatment Type: Individual Therapy  Reported Symptoms: anxiety  Mental Status Exam:  Appearance:   Casual     Behavior:  Appropriate  Motor:  Normal  Speech/Language:   Clear and Coherent  Affect:  Appropriate  Mood:  anxious  Thought process:  normal  Thought content:    WNL  Sensory/Perceptual disturbances:    WNL  Orientation:  oriented to person, place, time/date and situation  Attention:  Good  Concentration:  Good  Memory:  WNL  Fund of knowledge:   Good  Insight:    Good  Judgment:   Good  Impulse Control:  Good   Risk Assessment: Danger to Self:  No Self-injurious Behavior: No Danger to Others: No Duty to Warn:no Physical Aggression / Violence:No  Access to Firearms a concern: No  Gang Involvement:No   Subjective: The client states that when he contacted the career coach, he was already full.  The client states he Yanis Juma follow-up with him later and pay for services.  He reports that he got a raise and a bonus with his job which has been helpful.  "I am still below where I would like to be."  Today he states that his anxiety is up.  I do not have the zest or joy I used to have."  The client does state that he feels quite a bit flatter.  He and his wife are having ongoing issues with their middle son who has an explosive and unpredictable temper.  Trying to deal with him wears them both out.  Many times it is hard to get their children to bed at a decent hour which leaves them little or no time together. The client states that he dreads going home and feels anxious about that.  Today we used eye-movement focusing on that his negative cognition is, "I put work ahead of family."  He feels dread in his chest.  His subjective units of distress is a 5.  As the client processed he knows that he needs to find a new job.  He does  not feel like he has time to look currently.  I suggested the client refresh his linked in profile.  The client thought that was a good idea.  I also suggested that the client refreshes resume and give it to a headhunter.  He thought this might be helpful as well.  As we continued to process the client's subjective units of distress came down to less than 3.  He feels more positive about searching for a job.  He also feels somewhat refreshed in returning home.  Interventions: Assertiveness/Communication, Motivational Interviewing, Solution-Oriented/Positive Psychology, [employer] Desensitization and Reprocessing (EMDR) and Insight-Oriented  Diagnosis:   ICD-10-CM   1. Adjustment disorder with anxiety  F43.22     Plan: Mood independent behavior, refresh LinkedIn profile, positive self talk, exercise, boundaries, assertiveness, self-care.  Gelene Mink Adriella Essex, [facility]

## 2020-04-01 ENCOUNTER — Ambulatory Visit (INDEPENDENT_AMBULATORY_CARE_PROVIDER_SITE_OTHER): Payer: 59 | Admitting: Psychiatry

## 2020-04-01 ENCOUNTER — Encounter: Payer: Self-pay | Admitting: Psychiatry

## 2020-04-01 ENCOUNTER — Other Ambulatory Visit: Payer: Self-pay

## 2020-04-01 DIAGNOSIS — F4323 Adjustment disorder with mixed anxiety and depressed mood: Secondary | ICD-10-CM
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
## 2020-05-27 ENCOUNTER — Ambulatory Visit: Payer: 59 | Admitting: Psychiatry

## 2020-06-16 ENCOUNTER — Other Ambulatory Visit: Payer: Self-pay

## 2020-06-16 ENCOUNTER — Ambulatory Visit (INDEPENDENT_AMBULATORY_CARE_PROVIDER_SITE_OTHER): Payer: 59 | Admitting: Psychiatry

## 2020-06-16 ENCOUNTER — Encounter: Payer: Self-pay | Admitting: Psychiatry

## 2020-06-16 DIAGNOSIS — F4322 Adjustment disorder with anxiety: Secondary | ICD-10-CM

## 2020-06-16 NOTE — Progress Notes (Signed)
      [facility] Counselor/Therapist Progress Note  Patient ID: Martin Perez, MRN: [id number],    Date: 06/16/2020  Time Spent: 45 minutes   Treatment Type: Individual Therapy  Reported Symptoms: anxiety  Mental Status Exam:  Appearance:   Well Groomed     Behavior:  Appropriate  Motor:  Normal  Speech/Language:   Clear and Coherent  Affect:  Appropriate  Mood:  anxious  Thought process:  normal  Thought content:    WNL  Sensory/Perceptual disturbances:    WNL  Orientation:  oriented to person, place, time/date and situation  Attention:  Good  Concentration:  Good  Memory:  WNL  Fund of knowledge:   Good  Insight:    Good  Judgment:   Good  Impulse Control:  Good   Risk Assessment: Danger to Self:  No Self-injurious Behavior: No Danger to Others: No Duty to Warn:no Physical Aggression / Violence:No  Access to Firearms a concern: No  Gang Involvement:No   Subjective: The client wanted to discuss today how he sabotages himself.  He finds that in his decisions at work he does not trust his judgment and second-guesses things.  I used eye-movement with the client focusing on this issue.  His negative cognition is, "I am a fraud."  He feels anxiety in his chest. His subjective units of distress is a 6+.  As the client processed he identified that he was clearly afraid of conflict.  "I want to be liked."  This goes all the way back to his childhood.  The client saw his father being assertive and interpreted it as rude.  I pointed out to the client that instead of being "nice".  He was being a door mat.  He agreed.  I gave the client a handout on assertive rights.  The client clearly sees that he has a hard time asking for what he wants.  I suggested that he move to thinking about just the facts, especially at work.  Move those interactions out of the relationship zone and put them in the business zone.  Also we discussed about setting the boundaries upfront.  "If you cannot  make it on time more than once we Jheri Mitter have to use someone else."  The client noticed some anxiety about this.  I discussed with the client that he would have to practice mood independent change to make this work.  He thinks that he can do that.  His subjective units of distress is less than 3.  Interventions: Assertiveness/Communication, Motivational Interviewing, Solution-Oriented/Positive Psychology, [employer] Desensitization and Reprocessing (EMDR) and Insight-Oriented  Diagnosis:   ICD-10-CM   1. Adjustment disorder with anxiety  F43.22     Plan: Mood independent change, positive self talk, self-care, assertive rights-review handout, boundaries, exercise.  Martin Perez, [facility]

## 2020-07-16 ENCOUNTER — Ambulatory Visit: Payer: 59 | Admitting: Psychiatry

## 2020-07-29 ENCOUNTER — Encounter: Payer: Self-pay | Admitting: Psychiatry

## 2020-07-29 ENCOUNTER — Other Ambulatory Visit: Payer: Self-pay

## 2020-07-29 ENCOUNTER — Ambulatory Visit (INDEPENDENT_AMBULATORY_CARE_PROVIDER_SITE_OTHER): Payer: 59 | Admitting: Psychiatry

## 2020-07-29 DIAGNOSIS — F4322 Adjustment disorder with anxiety: Secondary | ICD-10-CM | POA: Diagnosis not present

## 2020-07-29 NOTE — Progress Notes (Signed)
      [facility] Counselor/Therapist Progress Note  Patient ID: Martin Perez, MRN: [id number],    Date: 07/29/2020  Time Spent: 50 minutes   Treatment Type: Individual Therapy  Reported Symptoms: anxiety  Mental Status Exam:  Appearance:   Well Groomed     Behavior:  Appropriate  Motor:  Normal  Speech/Language:   Clear and Coherent  Affect:  Appropriate  Mood:  anxious  Thought process:  normal  Thought content:    WNL  Sensory/Perceptual disturbances:    WNL  Orientation:  oriented to person, place, time/date and situation  Attention:  Good  Concentration:  Good  Memory:  WNL  Fund of knowledge:   Good  Insight:    Good  Judgment:   Good  Impulse Control:  Good   Risk Assessment: Danger to Self:  No Self-injurious Behavior: No Danger to Others: No Duty to Warn:no Physical Aggression / Violence:No  Access to Firearms a concern: No  Gang Involvement:No   Subjective: The client states that he has come into the more of a radical acceptance of where he is.  He states that he  And his wife had left the [facility] program unexpectedly.  "It hurt not only [place] but others too."  The client has just begun to realize that and has started writing letters of apology to a lot of those people.  He is trying not to just do a general apology but around specific things that happened. He states that most of the problems that he is experiencing have to do with poor follow-through.  "I also have horrible boundaries and I can't sit still."  The client use the bilateral stimulation hand paddles as he talked about these things.  His subjective units of distress started at a 6 and ended less than 4.  I asked the client to make some notes on his phone to help improve his follow-through.  He made a note about getting his wife a Christmas gift.  He states he never plans ahead and his wife is always disappointed.  I also asked the client what he needed to do to slow down?  He feels like if he  exercised more that it would be helpful.  I agreed and asked him when that could possibly happen?  The client is unsure but states that the gyms in [place] are much more expensive than they were in [place].  I discussed with the client to try to figure out how he can make it happen without necessarily going to a gym.  He agreed he would work on that.  He will also follow through on trying to figure out what he should get his wife for Christmas.  Interventions: Assertiveness/Communication, Motivational Interviewing, Solution-Oriented/Positive Psychology, [employer] Desensitization and Reprocessing (EMDR) and Insight-Oriented  Diagnosis:   ICD-10-CM   1. Adjustment disorder with anxiety  F43.22     Plan: Follow through with Christmas gift for wife, find exercise routine, positive self talk, assertiveness, boundaries, radical acceptance.  Gelene Mink Julliana Whitmyer, [facility]

## 2020-09-17 ENCOUNTER — Encounter: Payer: Self-pay | Admitting: Psychiatry

## 2020-09-17 ENCOUNTER — Ambulatory Visit (INDEPENDENT_AMBULATORY_CARE_PROVIDER_SITE_OTHER): Payer: 59 | Admitting: Psychiatry

## 2020-09-17 ENCOUNTER — Other Ambulatory Visit: Payer: Self-pay

## 2020-09-17 DIAGNOSIS — F4322 Adjustment disorder with anxiety: Secondary | ICD-10-CM

## 2020-09-17 NOTE — Progress Notes (Signed)
      [facility] Counselor/Therapist Progress Note  Patient ID: Martin Perez, MRN: [id number],    Date: 09/17/2020  Time Spent: 50 minutes   Treatment Type: Individual Therapy  Reported Symptoms: anxiety  Mental Status Exam:  Appearance:   Well Groomed     Behavior:  Appropriate  Motor:  Normal  Speech/Language:   Clear and Coherent  Affect:  Appropriate  Mood:  anxious  Thought process:  normal  Thought content:    WNL  Sensory/Perceptual disturbances:    WNL  Orientation:  oriented to person, place, time/date and situation  Attention:  Good  Concentration:  Good  Memory:  WNL  Fund of knowledge:   Good  Insight:    Good  Judgment:   Good  Impulse Control:  Good   Risk Assessment: Danger to Self:  No Self-injurious Behavior: No Danger to Others: No Duty to Warn:no Physical Aggression / Violence:No  Access to Firearms a concern: No  Gang Involvement:No   Subjective: Client states that he had no real break over Christmas.  There were still jobs to be done and he ended up going into the office more than he should have.  He found himself getting frustrated with an employee at work.  Today he identifies his anxiety and stress at a subjective units of distress of 8.  His negative cognition is, "where am I going in the future?"  He feels his anxiety in his chest.  As the client processed, he states that he feels stuck where he is in his career.  He is frustrated that he is not making enough income to support his family.  His middle son is having severe behavioral issues which they cannot seem to get a handle on.  The client is overwhelmed because of the financial issues.  He finds himself more stressed at work due to his perfectionism.  As we continued to process the client was able to begin to let go of some of his anxiety and perfectionism.  He realizes that he has to turn some of these things over to the care of God to the best of his ability.  He realizes that he needs  to focus on his spiritual life which is a major way of self-care for him.  He will begin to revisit that as well as increasing his exercise.  His subjective units of distress at the end of the session was less than 4.  He will just need to take it 1 day at a time.  The client agrees.  Interventions: Assertiveness/Communication, Motivational Interviewing, Solution-Oriented/Positive Psychology, [employer] Desensitization and Reprocessing (EMDR) and Insight-Oriented  Diagnosis:   ICD-10-CM   1. Adjustment disorder with anxiety  F43.22     Plan: Mood independent behavior, positive self talk, self-care, exercise, mindful prayer, assertiveness, boundaries.  Gelene Mink Lowery Paullin, [facility]

## 2020-10-29 ENCOUNTER — Other Ambulatory Visit: Payer: Self-pay

## 2020-10-29 ENCOUNTER — Ambulatory Visit (INDEPENDENT_AMBULATORY_CARE_PROVIDER_SITE_OTHER): Payer: 59 | Admitting: Psychiatry

## 2020-10-29 ENCOUNTER — Encounter: Payer: Self-pay | Admitting: Psychiatry

## 2020-10-29 DIAGNOSIS — F4322 Adjustment disorder with anxiety: Secondary | ICD-10-CM
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
## 2020-12-03 ENCOUNTER — Ambulatory Visit (INDEPENDENT_AMBULATORY_CARE_PROVIDER_SITE_OTHER): Payer: 59 | Admitting: Psychiatry

## 2020-12-03 ENCOUNTER — Other Ambulatory Visit: Payer: Self-pay

## 2020-12-03 ENCOUNTER — Encounter: Payer: Self-pay | Admitting: Psychiatry

## 2020-12-03 DIAGNOSIS — F4322 Adjustment disorder with anxiety: Secondary | ICD-10-CM

## 2020-12-03 NOTE — Progress Notes (Signed)
      [facility] Counselor/Therapist Progress Note  Patient ID: Martin Perez, MRN: [id number],    Date: 12/03/2020  Time Spent: 45 minutes   Treatment Type: Individual Therapy  Reported Symptoms: anxiety  Mental Status Exam:  Appearance:   Casual and Well Groomed     Behavior:  Appropriate  Motor:  Normal  Speech/Language:   Clear and Coherent  Affect:  Appropriate  Mood:  anxious  Thought process:  normal  Thought content:    WNL  Sensory/Perceptual disturbances:    WNL  Orientation:  oriented to person, place, time/date and situation  Attention:  Good  Concentration:  Good  Memory:  WNL  Fund of knowledge:   Good  Insight:    Good  Judgment:   Good  Impulse Control:  Good   Risk Assessment: Danger to Self:  No Self-injurious Behavior: No Danger to Others: No Duty to Warn:no Physical Aggression / Violence:No  Access to Firearms a concern: No  Gang Involvement:No    Subjective: The client realizes that he has been working too many hours.  His wife is upset that she does not get to spend enough time with him.  She is also drained at taking care of the 3 boys full-time.  The client states that he gets the message that he is not doing enough.  Today we started eye-movement focusing on this issue.  The client feels anxiety and tension in his chest.  His subjective units of distress is a 7.  As we processed the client asked the question, "what am I running from?"  He feels a block and tension inside of him.  He realizes that he has too much responsibility with family, with work, with not taking care of himself.  I asked the client what he was going to do to begin to change that?  The client states he has already began to change his work schedule from 7:30 AM to 3:30 PM.  This way he gets home at an earlier time to help his wife.  They are still trying to figure out school next year for their middle son.  He realizes that he needs to spend more time bonding with him and  just being present.  The client realizes that he has been over analyzing everything which has made him feel overloaded.  We switched to the bilateral stimulation hand paddles.  As the client visualized the block that he felt he saw Jesus come in and easily pick it up.  He realized it was not as hard as he thought.  He does not have to take things so seriously.  His positive cognition at the end of the session was, "lighten up!"  His subjective units of distress was a 1.   Interventions: Assertiveness/Communication, Motivational Interviewing, Solution-Oriented/Positive Psychology, [employer]ensitization [employer] Repro[employer]ing (EMDR) and Insight-Oriented  Diagnosis:   ICD-10-CM   1. Adjustment disorder with anxiety  F43.22     Plan: Make schedule adjustments, mood independent behavior, increased self-care, positive self talk, connection with wife, do not over analyze stay mindful in the present tense.  Martin Perez, [facility]

## 2021-01-06 ENCOUNTER — Other Ambulatory Visit: Payer: Self-pay

## 2021-01-06 ENCOUNTER — Encounter: Payer: Self-pay | Admitting: Psychiatry

## 2021-01-06 ENCOUNTER — Ambulatory Visit (INDEPENDENT_AMBULATORY_CARE_PROVIDER_SITE_OTHER): Payer: 59 | Admitting: Psychiatry

## 2021-01-06 DIAGNOSIS — F4322 Adjustment disorder with anxiety: Secondary | ICD-10-CM | POA: Diagnosis not present

## 2021-01-06 NOTE — Progress Notes (Signed)
      [facility] Counselor/Therapist Progress Note  Patient ID: Martin Perez, MRN: [id number],    Date: 01/06/2021  Time Spent: 45 minutes   Treatment Type: Individual Therapy  Reported Symptoms: anxious  Mental Status Exam:  Appearance:   Well Groomed     Behavior:  Appropriate  Motor:  Normal  Speech/Language:   Clear and Coherent  Affect:  Appropriate  Mood:  anxious  Thought process:  normal  Thought content:    WNL  Sensory/Perceptual disturbances:    WNL  Orientation:  oriented to person, place, time/date and situation  Attention:  Good  Concentration:  Good  Memory:  WNL  Fund of knowledge:   Good  Insight:    Good  Judgment:   Good  Impulse Control:  Good   Risk Assessment: Danger to Self:  No Self-injurious Behavior: No Danger to Others: No Duty to Warn:no Physical Aggression / Violence:No  Access to Firearms a concern: No  Gang Involvement:No   Subjective: The client states today that he is in a good place.  We discussed my upcoming retirement.  The client states that he feels good with the skills that he has.  He currently is working with a Psychologist, occupational that he will continue with.  If he needs follow-up the client will connect with Granville Lewis, LCSW.  The client has his contact information. The client states that he and his wife have decided to homeschool their 2 oldest sons and send their younger one to kindergarten.  He is concerned because his oldest son is angry because his second son is so aggressive.  The second son is doing much better on his current medication regimen.  He states they seem to be able to connect with him more effectively which has reduced his volatility.  The client is working with him on possibly getting involved with team sports.  The client states that his relationship with his wife is going okay.  He states their intimacy is not where he would like it but then they are in a very busy season of life.  The client and his wife have  connected with a local church that they feel good about.  The client has also found an exercise group through [facility] that has been very positive for him.  The client feels that he is in a good place moving forward.  Services ended by mutual consent.  Interventions: Assertiveness/Communication, Motivational Interviewing, Solution-Oriented/Positive Psychology and Insight-Oriented  Diagnosis:   ICD-10-CM   1. Adjustment disorder with anxiety  F43.22     Plan: exercise, positive self talk, self care, assertiveness, boundaries.  Gelene Mink Keeli Roberg, [facility]

## 2021-01-14 ENCOUNTER — Ambulatory Visit: Payer: 59 | Admitting: Psychiatry

## 2021-08-03 ENCOUNTER — Other Ambulatory Visit: Payer: Self-pay | Admitting: Family

## 2021-08-03 DIAGNOSIS — R748 Abnormal levels of other serum enzymes: Secondary | ICD-10-CM

## 2021-08-13 ENCOUNTER — Other Ambulatory Visit: Payer: Self-pay

## 2021-08-17 ENCOUNTER — Ambulatory Visit
Admission: RE | Admit: 2021-08-17 | Discharge: 2021-08-17 | Disposition: A | Discharge status: Home / Self Care | Payer: Self-pay | Source: Ambulatory Visit | Attending: Family | Admitting: Family

## 2021-08-17 DIAGNOSIS — R748 Abnormal levels of other serum enzymes: Secondary | ICD-10-CM

## 2021-10-21 DIAGNOSIS — R41840 Attention and concentration deficit: Secondary | ICD-10-CM | POA: Diagnosis not present

## 2021-10-21 DIAGNOSIS — Z6824 Body mass index (BMI) 24.0-24.9, adult: Secondary | ICD-10-CM | POA: Diagnosis not present

## 2021-10-21 DIAGNOSIS — Z79899 Other long term (current) drug therapy: Secondary | ICD-10-CM | POA: Diagnosis not present

## 2022-01-28 DIAGNOSIS — R41840 Attention and concentration deficit: Secondary | ICD-10-CM | POA: Diagnosis not present

## 2022-01-28 DIAGNOSIS — Z6825 Body mass index (BMI) 25.0-25.9, adult: Secondary | ICD-10-CM | POA: Diagnosis not present

## 2022-09-14 IMAGING — US US ABDOMEN COMPLETE
1 series · 14 of 25 positions shown · non-contrast
Comparison: None.

CLINICAL DATA: Elevated LFTs.

EXAM:
ABDOMEN ULTRASOUND COMPLETE

[Series 1: us abdomen complete · 0.15mm/px · 14 of 95 slices shown]
[im 1/95]
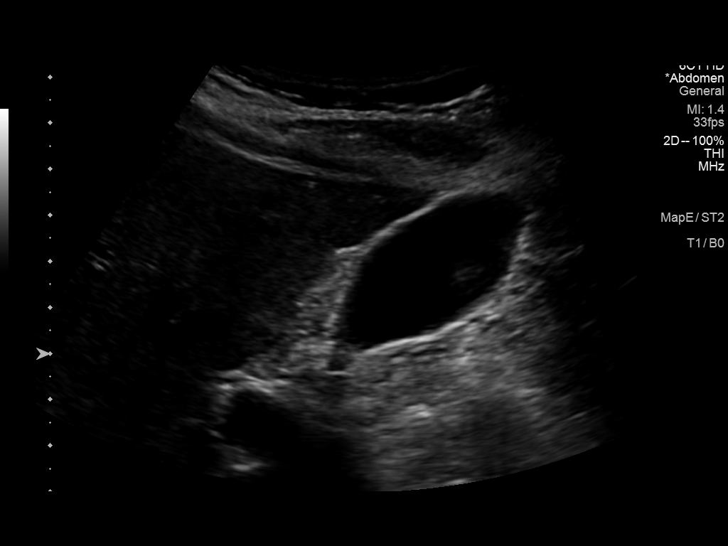
[im 8/95]
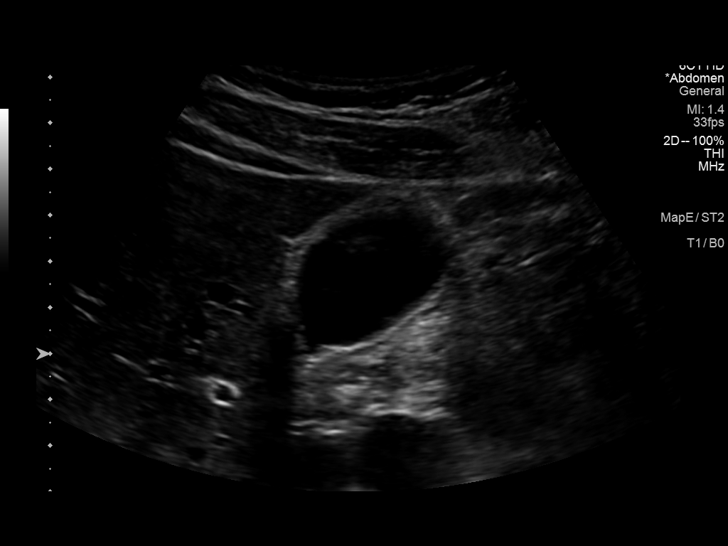
[im 16/95]
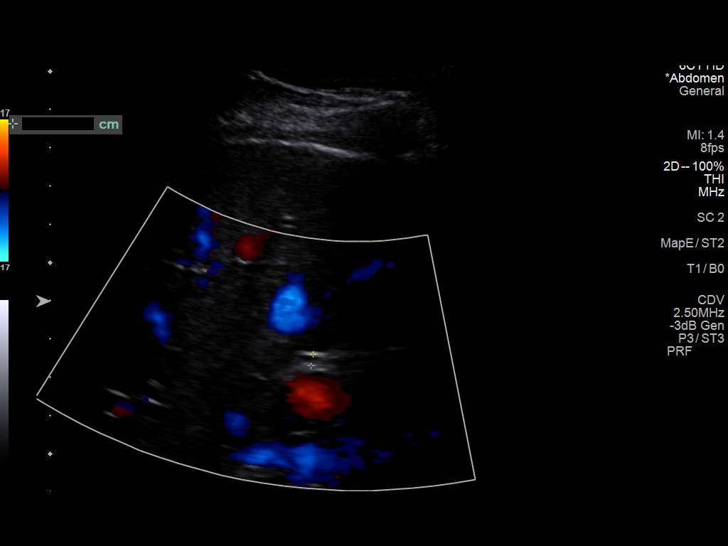
[im 24/95]
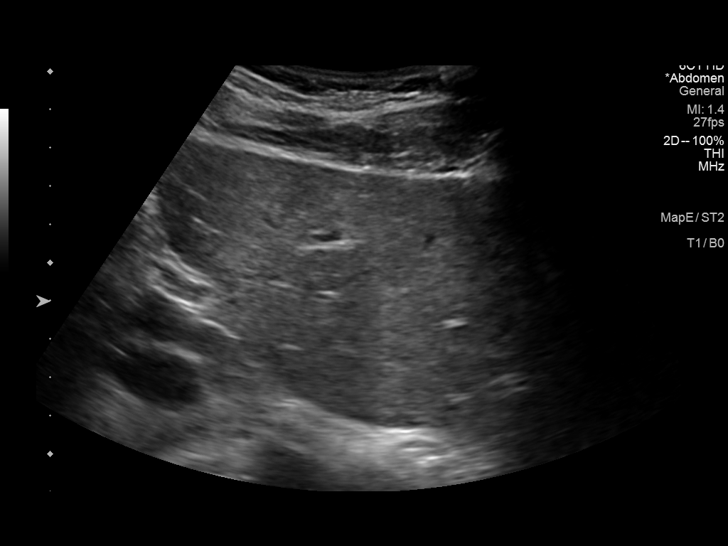
[im 32/95]
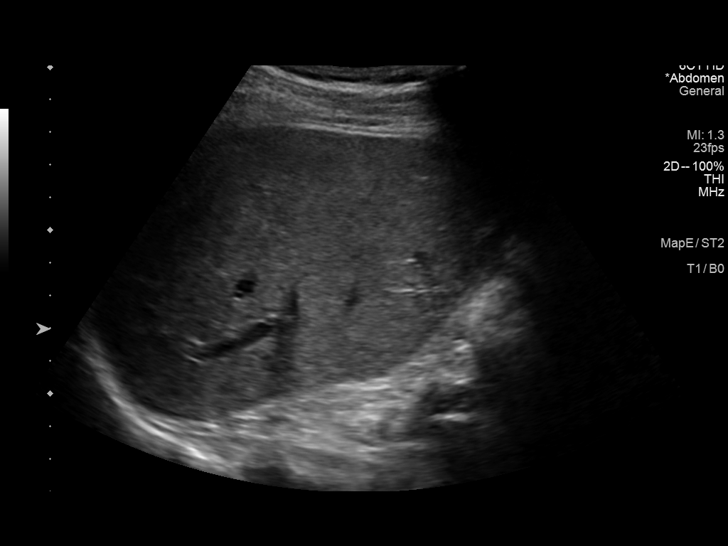
[im 36/95]
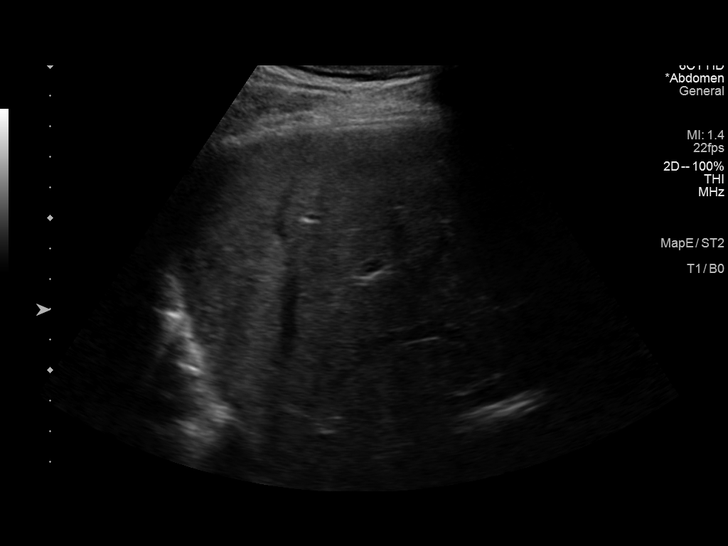
[im 44/95]
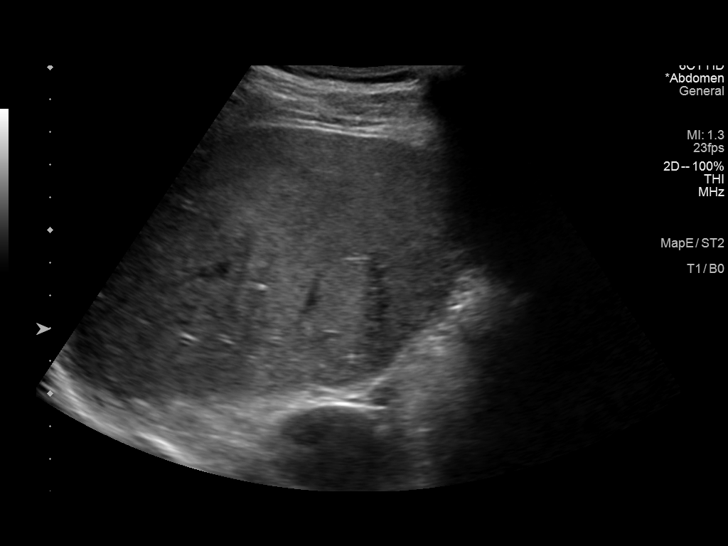
[im 51/95]
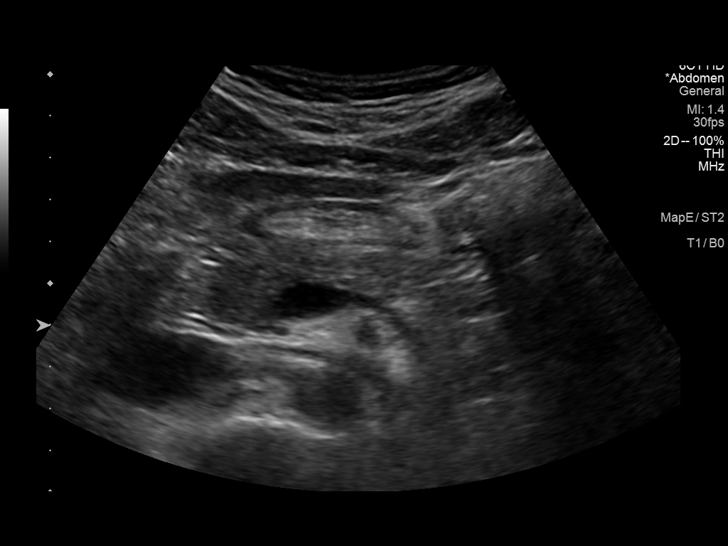
[im 59/95]
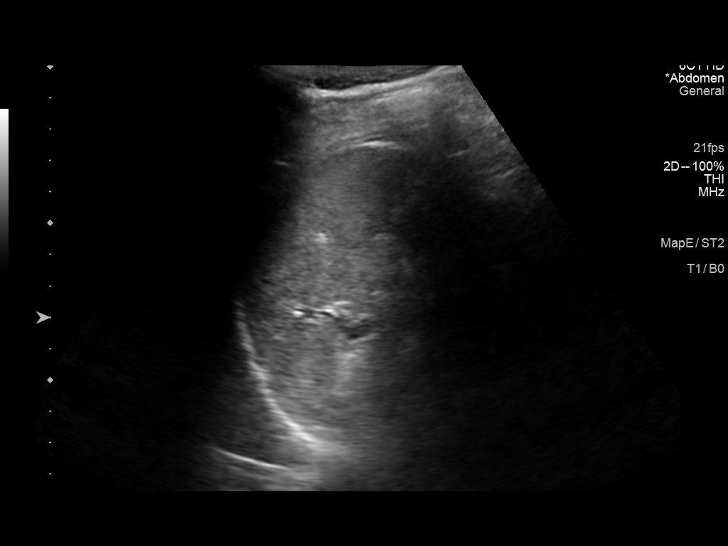
[im 63/95]
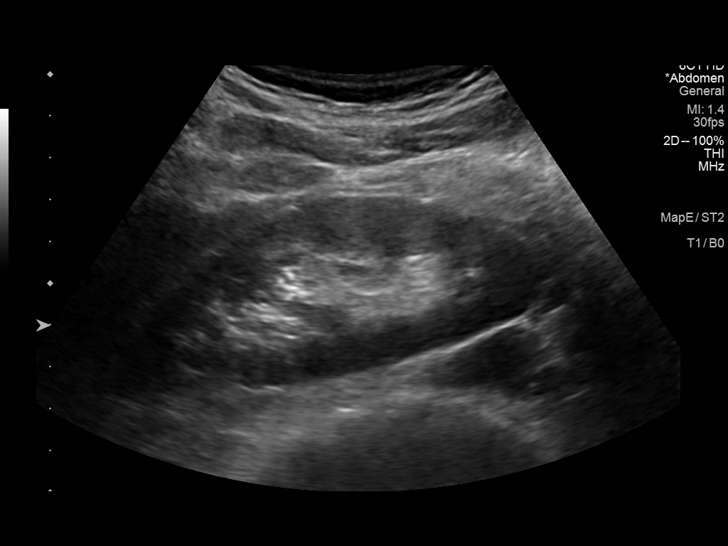
[im 71/95]
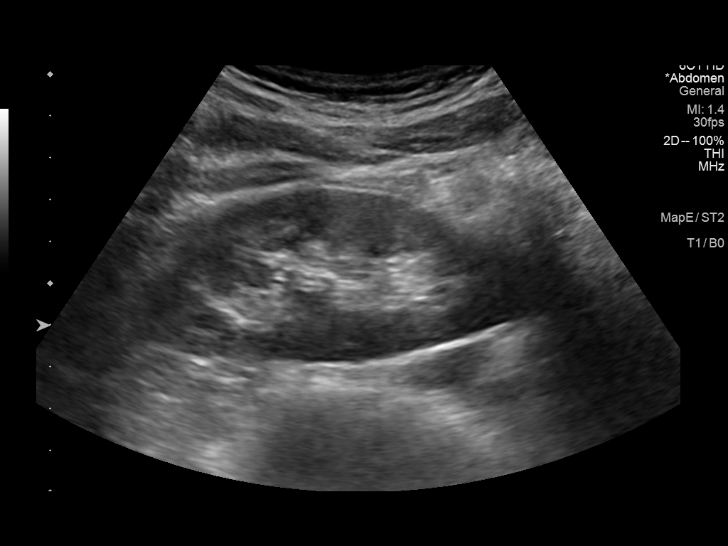
[im 79/95]
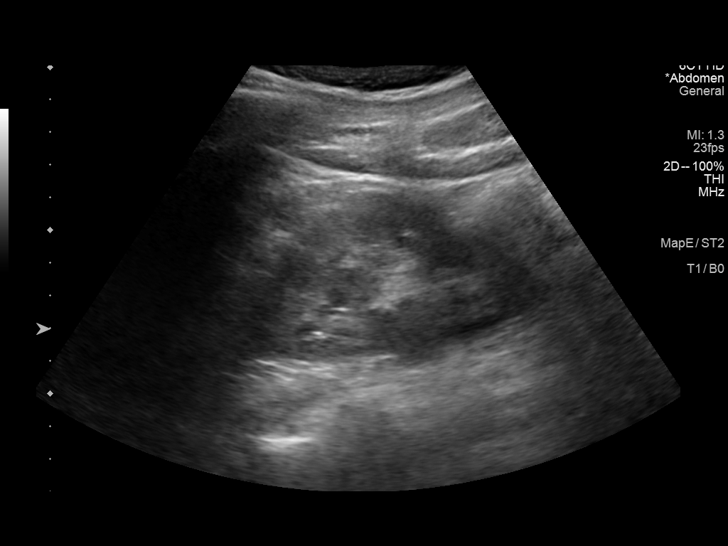
[im 87/95]
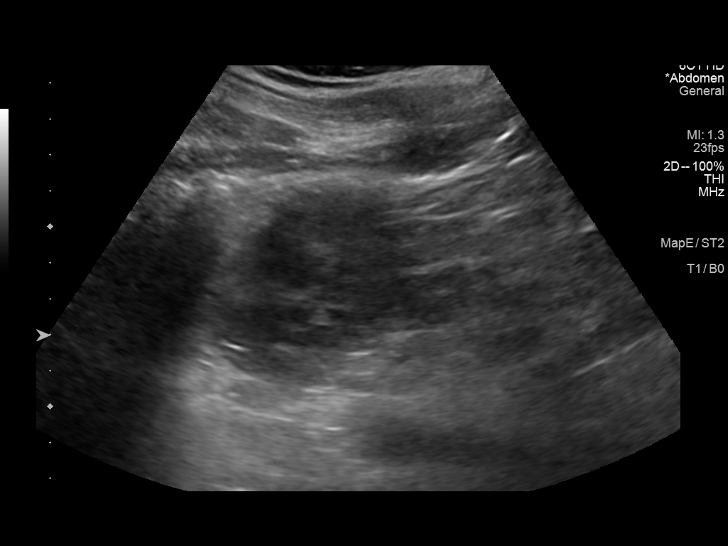
[im 95/95]
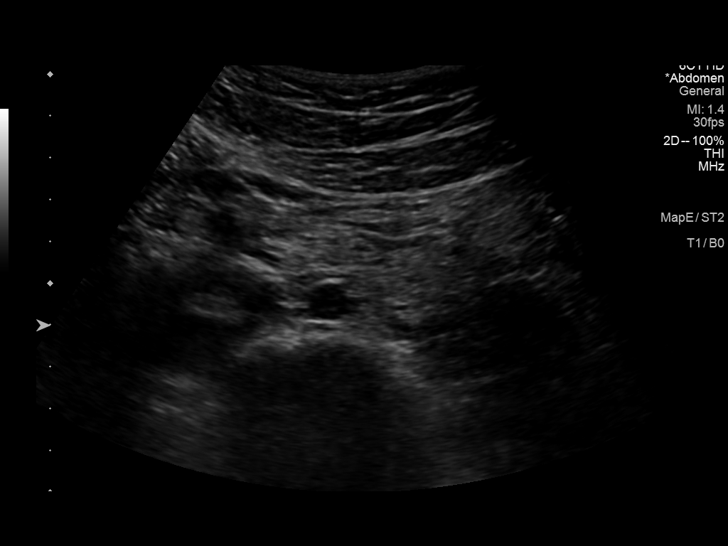

[14 of 25 positions shown; findings below may reference images not displayed]

FINDINGS: Gallbladder: No gallstones or wall thickening visualized. No
sonographic Murphy sign noted by sonographer.

Common bile duct: Diameter: 3 mm

Liver: No focal lesion identified. Within normal limits in
parenchymal echogenicity. Portal vein is patent on color Doppler
imaging with normal direction of blood flow towards the liver.

IVC: No abnormality visualized.

Pancreas: Visualized portion unremarkable.

Spleen: Size and appearance within normal limits.

Right Kidney: Length: 10.6 cm. Echogenicity within normal limits. No
mass or hydronephrosis visualized.

Left Kidney: Length: 12.6 cm. Echogenicity within normal limits. No
mass or hydronephrosis visualized.

Abdominal aorta: No aneurysm visualized.

Other findings: None.
IMPRESSION: Unremarkable ultrasound of the abdomen.

## 2023-05-26 DIAGNOSIS — Z125 Encounter for screening for malignant neoplasm of prostate: Secondary | ICD-10-CM | POA: Diagnosis not present

## 2023-05-26 DIAGNOSIS — Z23 Encounter for immunization: Secondary | ICD-10-CM | POA: Diagnosis not present

## 2023-05-26 DIAGNOSIS — F900 Attention-deficit hyperactivity disorder, predominantly inattentive type: Secondary | ICD-10-CM | POA: Diagnosis not present

## 2023-05-26 DIAGNOSIS — Z0000 Encounter for general adult medical examination without abnormal findings: Secondary | ICD-10-CM | POA: Diagnosis not present

## 2023-05-26 DIAGNOSIS — R7301 Impaired fasting glucose: Secondary | ICD-10-CM | POA: Diagnosis not present

## 2023-06-01 DIAGNOSIS — Z0000 Encounter for general adult medical examination without abnormal findings: Secondary | ICD-10-CM | POA: Diagnosis not present

## 2023-06-01 DIAGNOSIS — Z125 Encounter for screening for malignant neoplasm of prostate: Secondary | ICD-10-CM | POA: Diagnosis not present

## 2023-06-01 DIAGNOSIS — F900 Attention-deficit hyperactivity disorder, predominantly inattentive type: Secondary | ICD-10-CM | POA: Diagnosis not present

## 2023-06-09 DIAGNOSIS — K529 Noninfective gastroenteritis and colitis, unspecified: Secondary | ICD-10-CM | POA: Diagnosis not present

## 2023-06-09 DIAGNOSIS — E739 Lactose intolerance, unspecified: Secondary | ICD-10-CM | POA: Diagnosis not present

## 2023-07-06 DIAGNOSIS — R197 Diarrhea, unspecified: Secondary | ICD-10-CM | POA: Diagnosis not present

## 2023-07-06 DIAGNOSIS — K5730 Diverticulosis of large intestine without perforation or abscess without bleeding: Secondary | ICD-10-CM | POA: Diagnosis not present

## 2023-07-06 DIAGNOSIS — K648 Other hemorrhoids: Secondary | ICD-10-CM | POA: Diagnosis not present

## 2023-07-06 DIAGNOSIS — Z1211 Encounter for screening for malignant neoplasm of colon: Secondary | ICD-10-CM | POA: Diagnosis not present

## 2024-06-11 DIAGNOSIS — Z0000 Encounter for general adult medical examination without abnormal findings: Secondary | ICD-10-CM | POA: Diagnosis not present

## 2024-06-11 DIAGNOSIS — F900 Attention-deficit hyperactivity disorder, predominantly inattentive type: Secondary | ICD-10-CM | POA: Diagnosis not present

## 2024-06-11 DIAGNOSIS — F1590 Other stimulant use, unspecified, uncomplicated: Secondary | ICD-10-CM | POA: Diagnosis not present
